# Patient Record
Sex: Male | Born: 1980
Health system: Southern US, Community
[De-identification: ages and names within clinical notes are randomized; demographics above are authoritative.]

## PROBLEM LIST (undated history)

## (undated) DIAGNOSIS — G8929 Other chronic pain: Secondary | ICD-10-CM

## (undated) DIAGNOSIS — K5792 Diverticulitis of intestine, part unspecified, without perforation or abscess without bleeding: Secondary | ICD-10-CM

## (undated) DIAGNOSIS — M797 Fibromyalgia: Secondary | ICD-10-CM

## (undated) DIAGNOSIS — F32A Depression, unspecified: Secondary | ICD-10-CM

## (undated) DIAGNOSIS — F419 Anxiety disorder, unspecified: Secondary | ICD-10-CM

## (undated) DIAGNOSIS — G4700 Insomnia, unspecified: Secondary | ICD-10-CM

## (undated) DIAGNOSIS — N200 Calculus of kidney: Secondary | ICD-10-CM

## (undated) DIAGNOSIS — M549 Dorsalgia, unspecified: Secondary | ICD-10-CM

## (undated) DIAGNOSIS — F329 Major depressive disorder, single episode, unspecified: Secondary | ICD-10-CM

## (undated) HISTORY — PX: COLON RESECTION: SHX5231

## (undated) HISTORY — PX: ABDOMINAL SURGERY: SHX537

## (undated) HISTORY — PX: TONSILLECTOMY: SUR1361

## (undated) HISTORY — DX: Fibromyalgia: M79.7

## (undated) HISTORY — PX: APPENDECTOMY: SHX54

---

## 2015-07-31 DIAGNOSIS — Z09 Encounter for follow-up examination after completed treatment for conditions other than malignant neoplasm: Secondary | ICD-10-CM | POA: Insufficient documentation

## 2015-12-13 ENCOUNTER — Emergency Department (HOSPITAL_BASED_OUTPATIENT_CLINIC_OR_DEPARTMENT_OTHER)
Admission: EM | Admit: 2015-12-13 | Discharge: 2015-12-13 | Disposition: A | Discharge status: Home / Self Care | Payer: BLUE CROSS/BLUE SHIELD | Attending: Emergency Medicine | Admitting: Emergency Medicine

## 2015-12-13 ENCOUNTER — Encounter (HOSPITAL_BASED_OUTPATIENT_CLINIC_OR_DEPARTMENT_OTHER): Payer: Self-pay | Admitting: Emergency Medicine

## 2015-12-13 DIAGNOSIS — F17210 Nicotine dependence, cigarettes, uncomplicated: Secondary | ICD-10-CM | POA: Insufficient documentation

## 2015-12-13 DIAGNOSIS — M545 Low back pain: Secondary | ICD-10-CM | POA: Diagnosis not present

## 2015-12-13 DIAGNOSIS — R079 Chest pain, unspecified: Secondary | ICD-10-CM | POA: Diagnosis not present

## 2015-12-13 DIAGNOSIS — R1031 Right lower quadrant pain: Secondary | ICD-10-CM | POA: Diagnosis not present

## 2015-12-13 DIAGNOSIS — R300 Dysuria: Secondary | ICD-10-CM | POA: Diagnosis not present

## 2015-12-13 DIAGNOSIS — R109 Unspecified abdominal pain: Secondary | ICD-10-CM | POA: Diagnosis present

## 2015-12-13 HISTORY — DX: Other chronic pain: G89.29

## 2015-12-13 HISTORY — DX: Dorsalgia, unspecified: M54.9

## 2015-12-13 HISTORY — DX: Diverticulitis of intestine, part unspecified, without perforation or abscess without bleeding: K57.92

## 2015-12-13 HISTORY — DX: Calculus of kidney: N20.0

## 2015-12-13 LAB — COMPREHENSIVE METABOLIC PANEL
ALT: 13 U/L — ABNORMAL LOW (ref 17–63)
AST: 24 U/L (ref 15–41)
Albumin: 5.1 g/dL — ABNORMAL HIGH (ref 3.5–5.0)
Alkaline Phosphatase: 63 U/L (ref 38–126)
Anion gap: 6 (ref 5–15)
BILIRUBIN TOTAL: 1.6 mg/dL — AB (ref 0.3–1.2)
BUN: 15 mg/dL (ref 6–20)
CHLORIDE: 104 mmol/L (ref 101–111)
CO2: 27 mmol/L (ref 22–32)
Calcium: 9.5 mg/dL (ref 8.9–10.3)
Creatinine, Ser: 0.93 mg/dL (ref 0.61–1.24)
Glucose, Bld: 81 mg/dL (ref 65–99)
POTASSIUM: 3.6 mmol/L (ref 3.5–5.1)
Sodium: 137 mmol/L (ref 135–145)
TOTAL PROTEIN: 8 g/dL (ref 6.5–8.1)

## 2015-12-13 LAB — CBC WITH DIFFERENTIAL/PLATELET
BAND NEUTROPHILS: 3 %
Basophils Absolute: 0.1 10*3/uL (ref 0.0–0.1)
Basophils Relative: 1 %
Eosinophils Absolute: 0 10*3/uL (ref 0.0–0.7)
Eosinophils Relative: 0 %
HEMATOCRIT: 43.8 % (ref 39.0–52.0)
Hemoglobin: 15.7 g/dL (ref 13.0–17.0)
LYMPHS ABS: 1.9 10*3/uL (ref 0.7–4.0)
LYMPHS PCT: 37 %
MCH: 33.3 pg (ref 26.0–34.0)
MCHC: 35.8 g/dL (ref 30.0–36.0)
MCV: 92.8 fL (ref 78.0–100.0)
MONO ABS: 0.4 10*3/uL (ref 0.1–1.0)
MYELOCYTES: 1 %
Monocytes Relative: 8 %
NEUTROS ABS: 2.6 10*3/uL (ref 1.7–7.7)
Neutrophils Relative %: 50 %
PLATELETS: 197 10*3/uL (ref 150–400)
RBC: 4.72 MIL/uL (ref 4.22–5.81)
RDW: 11.9 % (ref 11.5–15.5)
WBC: 5 10*3/uL (ref 4.0–10.5)

## 2015-12-13 LAB — URINALYSIS, ROUTINE W REFLEX MICROSCOPIC
Bilirubin Urine: NEGATIVE
GLUCOSE, UA: NEGATIVE mg/dL
HGB URINE DIPSTICK: NEGATIVE
LEUKOCYTES UA: NEGATIVE
Nitrite: NEGATIVE
PROTEIN: NEGATIVE mg/dL
Specific Gravity, Urine: 1.023 (ref 1.005–1.030)
pH: 7 (ref 5.0–8.0)

## 2015-12-13 LAB — LIPASE, BLOOD: LIPASE: 30 U/L (ref 11–51)

## 2015-12-13 NOTE — ED Provider Notes (Signed)
[facility] Provider Note   CSN: [id number] Arrival date & time: 12/13/15  1931  First Provider Contact:  First MD Initiated Contact with Patient 12/13/15 2001        History   Chief Complaint Chief Complaint  Patient presents with  . Abdominal Pain    HPI Drew Floyd is a 35 y.o. male.  Patient presents today with multiple complaints of chest pain, back pain, and intermittent RLQ / right sided flank pain for the past year that has worsened over the past few days. Patient reports a history of a ruptured diverticulum vs. Polyp (patient unsure) at the age of 11 requiring emergency surgery. He said his appendix was also removed at that time. Patient is follow by GI at [facility] in [facility] for colonoscopies every 4-6 years, most recently 2 months ago. He says they found and removed multiple polyps that were benign. He also had an upper endoscopy at that time with EGD dilation for a hiatal hernia. He reports a history of kidney stones 4 months ago that have since past. Ultrasound 2 months ago by his GI doctor showed no recurrent stones. He does endorse symptoms of dysuria. Denies hematuria. No fevers at home. As far as his chest pain, he describes it as left sided with radiation down his left arm. He has been having this chest pain intermittently for years. It is sharp and non exertional. Often occurs at rest. Patient's wife is a Engineer, civil (consulting) and has told him she believes it to be anxiety related. Lastly, patient complains of chronic lower back pain. 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 He has been worked up by his PCP with plain films and CT scan that were negative.       Past Medical History:  Diagnosis Date  . Chronic back pain   . Diverticulitis   . Kidney stone     There are no active problems to display for this patient.   Past Surgical History:  Procedure Laterality Date  . ABDOMINAL SURGERY         Home Medications    Prior to  Admission medications   Not on File    Family History History reviewed. No pertinent family history.  Social History Social History  Substance Use Topics  . Smoking status: Current Every Day Smoker    Types: Cigarettes  . Smokeless tobacco: Never Used  . Alcohol use No     Allergies   Penicillins   Review of Systems Review of Systems  Constitutional: Negative.  Negative for fever.  HENT: Negative.   Eyes: Negative.   Respiratory: Negative.   Cardiovascular: Positive for chest pain. Negative for palpitations.  Gastrointestinal: Positive for abdominal pain. Negative for vomiting.  Endocrine: Negative.   Genitourinary: Positive for dysuria.  Musculoskeletal: Positive for back pain.  Skin: Negative.   Allergic/Immunologic: Negative.   Neurological: Negative.   Hematological: Negative.   Psychiatric/Behavioral: Negative.      Physical Exam Updated Vital Signs BP 132/85 (BP Location: Right Arm)   Pulse (!) 59   Temp 97.6 F (36.4 C) (Oral)   Resp 18   Ht  (1.753 m)   Wt 56.7 kg   SpO2 99%   BMI 18.46 kg/m   Physical Exam  Constitutional: He appears well-developed and well-nourished.  HENT:  Head: Normocephalic and atraumatic.  Eyes: Conjunctivae are normal.  Neck: Neck supple.  Cardiovascular: Normal rate and regular rhythm.   No murmur heard. Pulmonary/Chest:  Effort normal and breath sounds normal. No respiratory distress.  Abdominal: Soft. Bowel sounds are normal. There is tenderness.  Well healed RLQ incision, mildly tender to deep palpation   Musculoskeletal: He exhibits no edema.  Neurological: He is alert.  Skin: Skin is warm and dry.  Psychiatric: He has a normal mood and affect.  Nursing note and vitals reviewed.    ED Treatments / Results  Labs (all labs ordered are listed, but only abnormal results are displayed) Labs Reviewed  COMPREHENSIVE METABOLIC PANEL - Abnormal; Notable for the following:       Result Value   Albumin 5.1  (*)    ALT 13 (*)    Total Bilirubin 1.6 (*)    All other components within normal limits  URINALYSIS, ROUTINE W REFLEX MICROSCOPIC (NOT AT [facility]) - Abnormal; Notable for the following:    Ketones, ur >80 (*)    All other components within normal limits  CBC WITH DIFFERENTIAL/PLATELET  LIPASE, BLOOD    EKG  EKG Interpretation None       Radiology No results found.  Procedures Procedures (including critical care time)  Medications Ordered in ED Medications - No data to display   Initial Impression / Assessment and Plan / ED Course  I have reviewed the triage vital signs and the nursing notes.  Pertinent labs & imaging results that were available during my care of the patient were reviewed by me and considered in my medical decision making (see chart for details).  Clinical Course  Value Comment By Time  Total Bilirubin: (!) 1.6 (Reviewed) Nelva Nayobert Beaton, MD 08/11 2110   Abdominal pain: Recent CT in March negative. UA, CBC, CMP, and lipase unremarkable . Exam benign. Offered patient reassurance. No indication for further imaging at this time. Encouraged patient to follow up with PCP and GI doctor for further evaluation.   Final Clinical Impressions(s) / ED Diagnoses   Final diagnoses:  Right lower quadrant abdominal pain    New Prescriptions There are no discharge medications for this patient.    Reymundo Pollarolyn Floree Zuniga, MD 12/14/15 1305     Reymundo Pollarolyn Zahli Vetsch, MD 12/14/15 [id number]    Nelva Nayobert Beaton, MD 12/22/15 [phone]

## 2015-12-13 NOTE — ED Triage Notes (Signed)
Patient states that he is having right sided abdominal pain and right flank pain. Patient has a history of a ruptured diverticulum in 2001 and since he had his last Colonoscopy 2 months ago he feels like "I am systematically breaking down" - Reports left sided chest pain and arm pain x 1 month.

## 2015-12-13 NOTE — Discharge Instructions (Signed)
Please follow up with your primary care doctor for further work up and evaluation.

## 2016-02-24 DIAGNOSIS — R1030 Lower abdominal pain, unspecified: Secondary | ICD-10-CM | POA: Diagnosis not present

## 2016-02-24 DIAGNOSIS — G8929 Other chronic pain: Secondary | ICD-10-CM | POA: Diagnosis not present

## 2016-02-24 DIAGNOSIS — M542 Cervicalgia: Secondary | ICD-10-CM | POA: Diagnosis not present

## 2016-02-24 DIAGNOSIS — R29898 Other symptoms and signs involving the musculoskeletal system: Secondary | ICD-10-CM | POA: Diagnosis not present

## 2016-02-24 DIAGNOSIS — M545 Low back pain: Secondary | ICD-10-CM | POA: Diagnosis not present

## 2016-02-27 DIAGNOSIS — N401 Enlarged prostate with lower urinary tract symptoms: Secondary | ICD-10-CM | POA: Diagnosis not present

## 2016-02-27 DIAGNOSIS — N3289 Other specified disorders of bladder: Secondary | ICD-10-CM | POA: Diagnosis not present

## 2016-02-29 DIAGNOSIS — M5126 Other intervertebral disc displacement, lumbar region: Secondary | ICD-10-CM | POA: Diagnosis not present

## 2016-03-16 DIAGNOSIS — M545 Low back pain: Secondary | ICD-10-CM | POA: Diagnosis not present

## 2016-03-16 DIAGNOSIS — M4696 Unspecified inflammatory spondylopathy, lumbar region: Secondary | ICD-10-CM | POA: Diagnosis not present

## 2016-03-16 DIAGNOSIS — M5136 Other intervertebral disc degeneration, lumbar region: Secondary | ICD-10-CM | POA: Diagnosis not present

## 2016-03-16 DIAGNOSIS — R29898 Other symptoms and signs involving the musculoskeletal system: Secondary | ICD-10-CM | POA: Diagnosis not present

## 2016-05-01 DIAGNOSIS — M4696 Unspecified inflammatory spondylopathy, lumbar region: Secondary | ICD-10-CM | POA: Diagnosis not present

## 2016-05-01 DIAGNOSIS — M5136 Other intervertebral disc degeneration, lumbar region: Secondary | ICD-10-CM | POA: Diagnosis not present

## 2016-05-23 ENCOUNTER — Encounter (HOSPITAL_BASED_OUTPATIENT_CLINIC_OR_DEPARTMENT_OTHER): Payer: Self-pay | Admitting: Emergency Medicine

## 2016-05-23 ENCOUNTER — Emergency Department (HOSPITAL_BASED_OUTPATIENT_CLINIC_OR_DEPARTMENT_OTHER): Payer: BLUE CROSS/BLUE SHIELD

## 2016-05-23 DIAGNOSIS — R079 Chest pain, unspecified: Secondary | ICD-10-CM | POA: Diagnosis not present

## 2016-05-23 DIAGNOSIS — F419 Anxiety disorder, unspecified: Secondary | ICD-10-CM | POA: Diagnosis not present

## 2016-05-23 DIAGNOSIS — R072 Precordial pain: Secondary | ICD-10-CM | POA: Diagnosis present

## 2016-05-23 DIAGNOSIS — F17210 Nicotine dependence, cigarettes, uncomplicated: Secondary | ICD-10-CM | POA: Diagnosis not present

## 2016-05-23 DIAGNOSIS — F411 Generalized anxiety disorder: Secondary | ICD-10-CM | POA: Diagnosis not present

## 2016-05-23 LAB — CBC
HCT: 44 % (ref 39.0–52.0)
Hemoglobin: 15.7 g/dL (ref 13.0–17.0)
MCH: 32.8 pg (ref 26.0–34.0)
MCHC: 35.7 g/dL (ref 30.0–36.0)
MCV: 91.9 fL (ref 78.0–100.0)
PLATELETS: 226 10*3/uL (ref 150–400)
RBC: 4.79 MIL/uL (ref 4.22–5.81)
RDW: 12.1 % (ref 11.5–15.5)
WBC: 5 10*3/uL (ref 4.0–10.5)

## 2016-05-23 LAB — BASIC METABOLIC PANEL
Anion gap: 8 (ref 5–15)
BUN: 16 mg/dL (ref 6–20)
CALCIUM: 10 mg/dL (ref 8.9–10.3)
CHLORIDE: 101 mmol/L (ref 101–111)
CO2: 29 mmol/L (ref 22–32)
CREATININE: 0.82 mg/dL (ref 0.61–1.24)
Glucose, Bld: 95 mg/dL (ref 65–99)
Potassium: 4.1 mmol/L (ref 3.5–5.1)
SODIUM: 138 mmol/L (ref 135–145)

## 2016-05-23 LAB — TROPONIN I

## 2016-05-23 NOTE — ED Triage Notes (Signed)
Patient states that he is having pain to his left chest up into her left chest and down her left arm. He states that the his wife is a Engineer, civil (consulting)nurse and he "was having gaps in his hear beat when he is having the pain"

## 2016-05-24 ENCOUNTER — Emergency Department (HOSPITAL_BASED_OUTPATIENT_CLINIC_OR_DEPARTMENT_OTHER)
Admission: EM | Admit: 2016-05-24 | Discharge: 2016-05-24 | Disposition: A | Discharge status: Home / Self Care | Payer: BLUE CROSS/BLUE SHIELD | Attending: Emergency Medicine | Admitting: Emergency Medicine

## 2016-05-24 ENCOUNTER — Encounter (HOSPITAL_BASED_OUTPATIENT_CLINIC_OR_DEPARTMENT_OTHER): Payer: Self-pay | Admitting: *Deleted

## 2016-05-24 DIAGNOSIS — F419 Anxiety disorder, unspecified: Secondary | ICD-10-CM

## 2016-05-24 HISTORY — DX: Depression, unspecified: F32.A

## 2016-05-24 HISTORY — DX: Insomnia, unspecified: G47.00

## 2016-05-24 HISTORY — DX: Anxiety disorder, unspecified: F41.9

## 2016-05-24 HISTORY — DX: Major depressive disorder, single episode, unspecified: F32.9

## 2016-05-24 NOTE — ED Provider Notes (Signed)
[facility]   CSN: [id number] Arrival date & time: 05/23/16  2034     History   Chief Complaint Chief Complaint  Patient presents with  . Chest Pain    HPI Drew Floyd is a 36 y.o. male.  The history is provided by the patient.  Chest Pain   This is a chronic problem. The current episode started more than 1 week ago (6 months ago). The problem occurs constantly. The problem has been gradually worsening. The pain is associated with rest (with smoking pot and is under stress due to back issues and loss of job and feels shaky all over with the episode). The pain is present in the substernal region. The pain is moderate. The quality of the pain is described as dull. The pain does not radiate. Pertinent negatives include no abdominal pain, no claudication, no cough, no diaphoresis, no exertional chest pressure, no headaches, no hemoptysis, no lower extremity edema, no numbness, no orthopnea, no palpitations, no PND, no shortness of breath and no syncope. He has tried nothing for the symptoms. The treatment provided no relief. Risk factors include male gender.  Pertinent negatives for past medical history include no aneurysm and no MI.  Pertinent negatives for family medical history include: no aortic dissection.  Procedure history is negative for cardiac catheterization.  Last night mom gave him her xanax for same and it resolved the problem  Past Medical History:  Diagnosis Date  . Chronic back pain   . Diverticulitis   . Kidney stone     There are no active problems to display for this patient.   Past Surgical History:  Procedure Laterality Date  . ABDOMINAL SURGERY         Home Medications    Prior to Admission medications   Not on File    Family History History reviewed. No pertinent family history.  Social History Social History  Substance Use Topics  . Smoking status: Current Every Day Smoker    Types: Cigarettes  . Smokeless  tobacco: Never Used  . Alcohol use No     Allergies   Penicillins   Review of Systems Review of Systems  Constitutional: Negative for diaphoresis.  Respiratory: Negative for cough, hemoptysis and shortness of breath.   Cardiovascular: Positive for chest pain. Negative for palpitations, orthopnea, claudication, leg swelling, syncope and PND.  Gastrointestinal: Negative for abdominal pain.  Neurological: Positive for tremors. Negative for numbness and headaches.  Psychiatric/Behavioral: The patient is nervous/anxious.   All other systems reviewed and are negative.    Physical Exam Updated Vital Signs BP 116/88   Pulse 61   Temp 98.4 F (36.9 C) (Oral)   Resp 17   Ht 5\' 10"  (1.778 m)   Wt 118 lb (53.5 kg)   SpO2 99%   BMI 16.93 kg/m   Physical Exam  Constitutional: He is oriented to person, place, and time. He appears well-developed and well-nourished. No distress.  HENT:  Head: Normocephalic and atraumatic.  Mouth/Throat: No oropharyngeal exudate.  Eyes: EOM are normal. Pupils are equal, round, and reactive to light.  Neck: Normal range of motion. Neck supple. No JVD present. No tracheal deviation present.  Cardiovascular: Normal rate, regular rhythm, normal heart sounds and intact distal pulses.   Pulmonary/Chest: Effort normal and breath sounds normal. No stridor. He has no wheezes. He has no rales.  Abdominal: Soft. Bowel sounds are normal. He exhibits no mass. There is no tenderness. There is no rebound and  no guarding.  Musculoskeletal: Normal range of motion. He exhibits no tenderness or deformity.  Lymphadenopathy:    He has no cervical adenopathy.  Neurological: He is alert and oriented to person, place, and time. He displays normal reflexes. No sensory deficit. He exhibits normal muscle tone. Coordination normal.  Skin: Skin is warm and dry. Capillary refill takes less than 2 seconds.  Psychiatric: His mood appears anxious. He expresses no homicidal and no  suicidal ideation. He expresses no suicidal plans and no homicidal plans.     ED Treatments / Results   Vitals:   05/24/16 0230 05/24/16 0300  BP: 117/87 116/88  Pulse: 60 61  Resp: 16 17  Temp:     Results for orders placed or performed during the hospital encounter of 05/24/16  Basic metabolic panel  Result Value Ref Range   Sodium 138 135 - 145 mmol/L   Potassium 4.1 3.5 - 5.1 mmol/L   Chloride 101 101 - 111 mmol/L   CO2 29 22 - 32 mmol/L   Glucose, Bld 95 65 - 99 mg/dL   BUN 16 6 - 20 mg/dL   Creatinine, Ser 4.090.82 0.61 - 1.24 mg/dL   Calcium 81.110.0 8.9 - 91.410.3 mg/dL   GFR calc non Af Amer >60 >60 mL/min   GFR calc Af Amer >60 >60 mL/min   Anion gap 8 5 - 15  CBC  Result Value Ref Range   WBC 5.0 4.0 - 10.5 K/uL   RBC 4.79 4.22 - 5.81 MIL/uL   Hemoglobin 15.7 13.0 - 17.0 g/dL   HCT 78.244.0 95.639.0 - 21.352.0 %   MCV 91.9 78.0 - 100.0 fL   MCH 32.8 26.0 - 34.0 pg   MCHC 35.7 30.0 - 36.0 g/dL   RDW 08.612.1 57.811.5 - 46.915.5 %   Platelets 226 150 - 400 K/uL  Troponin I  Result Value Ref Range   Troponin I <0.03 <0.03 ng/mL   Dg Chest 2 View  Result Date: 05/23/2016 CLINICAL DATA:  Worsening chest pain for 2 months EXAM: CHEST  2 VIEW COMPARISON:  None. FINDINGS: Hyperinflation. No focal pulmonary infiltrate, consolidation or effusion. Normal heart size. No pneumothorax. IMPRESSION: Hyperinflation without focal infiltrate Electronically Signed   By: Jasmine PangKim  Fujinaga M.D.   On: 05/23/2016 21:36    EKG  EKG Interpretation  Date/Time:  Saturday May 23 2016 20:40:41 EST Ventricular Rate:  69 PR Interval:  148 QRS Duration: 90 QT Interval:  378 QTC Calculation: 405 R Axis:   107 Text Interpretation:  Normal sinus rhythm Rightward axis Pulmonary disease pattern Abnormal ECG No old tracing to compare Confirmed by BELFI  MD, MELANIE ([id number]) on 05/23/2016 9:27:26 PM       Radiology Dg Chest 2 View  Result Date: 05/23/2016 CLINICAL DATA:  Worsening chest pain for 2 months EXAM: CHEST   2 VIEW COMPARISON:  None. FINDINGS: Hyperinflation. No focal pulmonary infiltrate, consolidation or effusion. Normal heart size. No pneumothorax. IMPRESSION: Hyperinflation without focal infiltrate Electronically Signed   By: Jasmine PangKim  Fujinaga M.D.   On: 05/23/2016 21:36    Procedures Procedures (including critical care time)  Medications Ordered in ED Medications - No data to display  PERC negative wells 0 highyl doubt PE in this very low risk patient.  HEART score is 1 safe for discharge ruled out for MI in the ed  Final Clinical Impressions(s) / ED Diagnoses  Anxiety worse with pot usage.  Stop smoking and using marijuana.  Follow up with your PMD.  All questions answered to patient's satisfaction. Based on history and exam patient has been appropriately medically screened and emergency conditions excluded. Patient is stable for discharge at this time. Strict return precautions given for any further episodes, persistent fever, weakness or any concerns. New Prescriptions New Prescriptions   No medications on file     Kanyia Heaslip, MD 05/24/16 0403

## 2016-05-24 NOTE — ED Notes (Signed)
EDP into room 

## 2016-05-24 NOTE — ED Notes (Addendum)
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 Admits to h/o klonopin, and zyprexxa

## 2016-05-28 DIAGNOSIS — R5383 Other fatigue: Secondary | ICD-10-CM | POA: Diagnosis not present

## 2016-05-28 DIAGNOSIS — R109 Unspecified abdominal pain: Secondary | ICD-10-CM | POA: Diagnosis not present

## 2016-05-28 DIAGNOSIS — R079 Chest pain, unspecified: Secondary | ICD-10-CM | POA: Diagnosis not present

## 2016-05-28 DIAGNOSIS — Z681 Body mass index (BMI) 19 or less, adult: Secondary | ICD-10-CM | POA: Diagnosis not present

## 2016-06-01 DIAGNOSIS — M4696 Unspecified inflammatory spondylopathy, lumbar region: Secondary | ICD-10-CM | POA: Diagnosis not present

## 2016-06-01 DIAGNOSIS — M545 Low back pain: Secondary | ICD-10-CM | POA: Diagnosis not present

## 2016-06-01 DIAGNOSIS — M542 Cervicalgia: Secondary | ICD-10-CM | POA: Diagnosis not present

## 2016-06-01 DIAGNOSIS — M5136 Other intervertebral disc degeneration, lumbar region: Secondary | ICD-10-CM | POA: Diagnosis not present

## 2016-06-05 DIAGNOSIS — R0789 Other chest pain: Secondary | ICD-10-CM | POA: Diagnosis not present

## 2016-06-05 DIAGNOSIS — R748 Abnormal levels of other serum enzymes: Secondary | ICD-10-CM | POA: Diagnosis not present

## 2016-06-05 DIAGNOSIS — R1084 Generalized abdominal pain: Secondary | ICD-10-CM | POA: Diagnosis not present

## 2016-06-05 DIAGNOSIS — R55 Syncope and collapse: Secondary | ICD-10-CM | POA: Diagnosis not present

## 2016-06-05 DIAGNOSIS — R079 Chest pain, unspecified: Secondary | ICD-10-CM | POA: Diagnosis not present

## 2016-06-05 DIAGNOSIS — R109 Unspecified abdominal pain: Secondary | ICD-10-CM | POA: Diagnosis not present

## 2016-06-08 DIAGNOSIS — N179 Acute kidney failure, unspecified: Secondary | ICD-10-CM | POA: Diagnosis not present

## 2016-06-08 DIAGNOSIS — R1013 Epigastric pain: Secondary | ICD-10-CM | POA: Diagnosis not present

## 2016-06-08 DIAGNOSIS — N200 Calculus of kidney: Secondary | ICD-10-CM | POA: Diagnosis not present

## 2016-06-09 DIAGNOSIS — N200 Calculus of kidney: Secondary | ICD-10-CM | POA: Diagnosis not present

## 2016-06-09 DIAGNOSIS — R1013 Epigastric pain: Secondary | ICD-10-CM | POA: Diagnosis not present

## 2016-06-10 DIAGNOSIS — R079 Chest pain, unspecified: Secondary | ICD-10-CM | POA: Diagnosis not present

## 2016-06-18 DIAGNOSIS — F410 Panic disorder [episodic paroxysmal anxiety] without agoraphobia: Secondary | ICD-10-CM | POA: Diagnosis not present

## 2016-06-18 DIAGNOSIS — F418 Other specified anxiety disorders: Secondary | ICD-10-CM | POA: Diagnosis not present

## 2016-06-18 DIAGNOSIS — Z681 Body mass index (BMI) 19 or less, adult: Secondary | ICD-10-CM | POA: Diagnosis not present

## 2016-06-18 DIAGNOSIS — K5730 Diverticulosis of large intestine without perforation or abscess without bleeding: Secondary | ICD-10-CM | POA: Diagnosis not present

## 2016-07-16 DIAGNOSIS — F418 Other specified anxiety disorders: Secondary | ICD-10-CM | POA: Diagnosis not present

## 2016-07-16 DIAGNOSIS — R109 Unspecified abdominal pain: Secondary | ICD-10-CM | POA: Diagnosis not present

## 2016-07-16 DIAGNOSIS — F410 Panic disorder [episodic paroxysmal anxiety] without agoraphobia: Secondary | ICD-10-CM | POA: Diagnosis not present

## 2016-07-16 DIAGNOSIS — Z681 Body mass index (BMI) 19 or less, adult: Secondary | ICD-10-CM | POA: Diagnosis not present

## 2016-07-21 DIAGNOSIS — K011 Impacted teeth: Secondary | ICD-10-CM | POA: Diagnosis not present

## 2016-08-31 DIAGNOSIS — M5136 Other intervertebral disc degeneration, lumbar region: Secondary | ICD-10-CM | POA: Diagnosis not present

## 2016-08-31 DIAGNOSIS — M4696 Unspecified inflammatory spondylopathy, lumbar region: Secondary | ICD-10-CM | POA: Diagnosis not present

## 2016-08-31 DIAGNOSIS — M545 Low back pain: Secondary | ICD-10-CM | POA: Diagnosis not present

## 2016-08-31 DIAGNOSIS — M542 Cervicalgia: Secondary | ICD-10-CM | POA: Diagnosis not present

## 2017-03-26 DIAGNOSIS — Z5321 Procedure and treatment not carried out due to patient leaving prior to being seen by health care provider: Secondary | ICD-10-CM | POA: Diagnosis not present

## 2017-03-28 DIAGNOSIS — R079 Chest pain, unspecified: Secondary | ICD-10-CM | POA: Diagnosis not present

## 2017-05-31 IMAGING — CR DG CHEST 2V
2 series · 2 of 2 positions shown · non-contrast
Comparison: None.

CLINICAL DATA: Worsening chest pain for 2 months

EXAM:
CHEST  2 VIEW

[w chest pa]
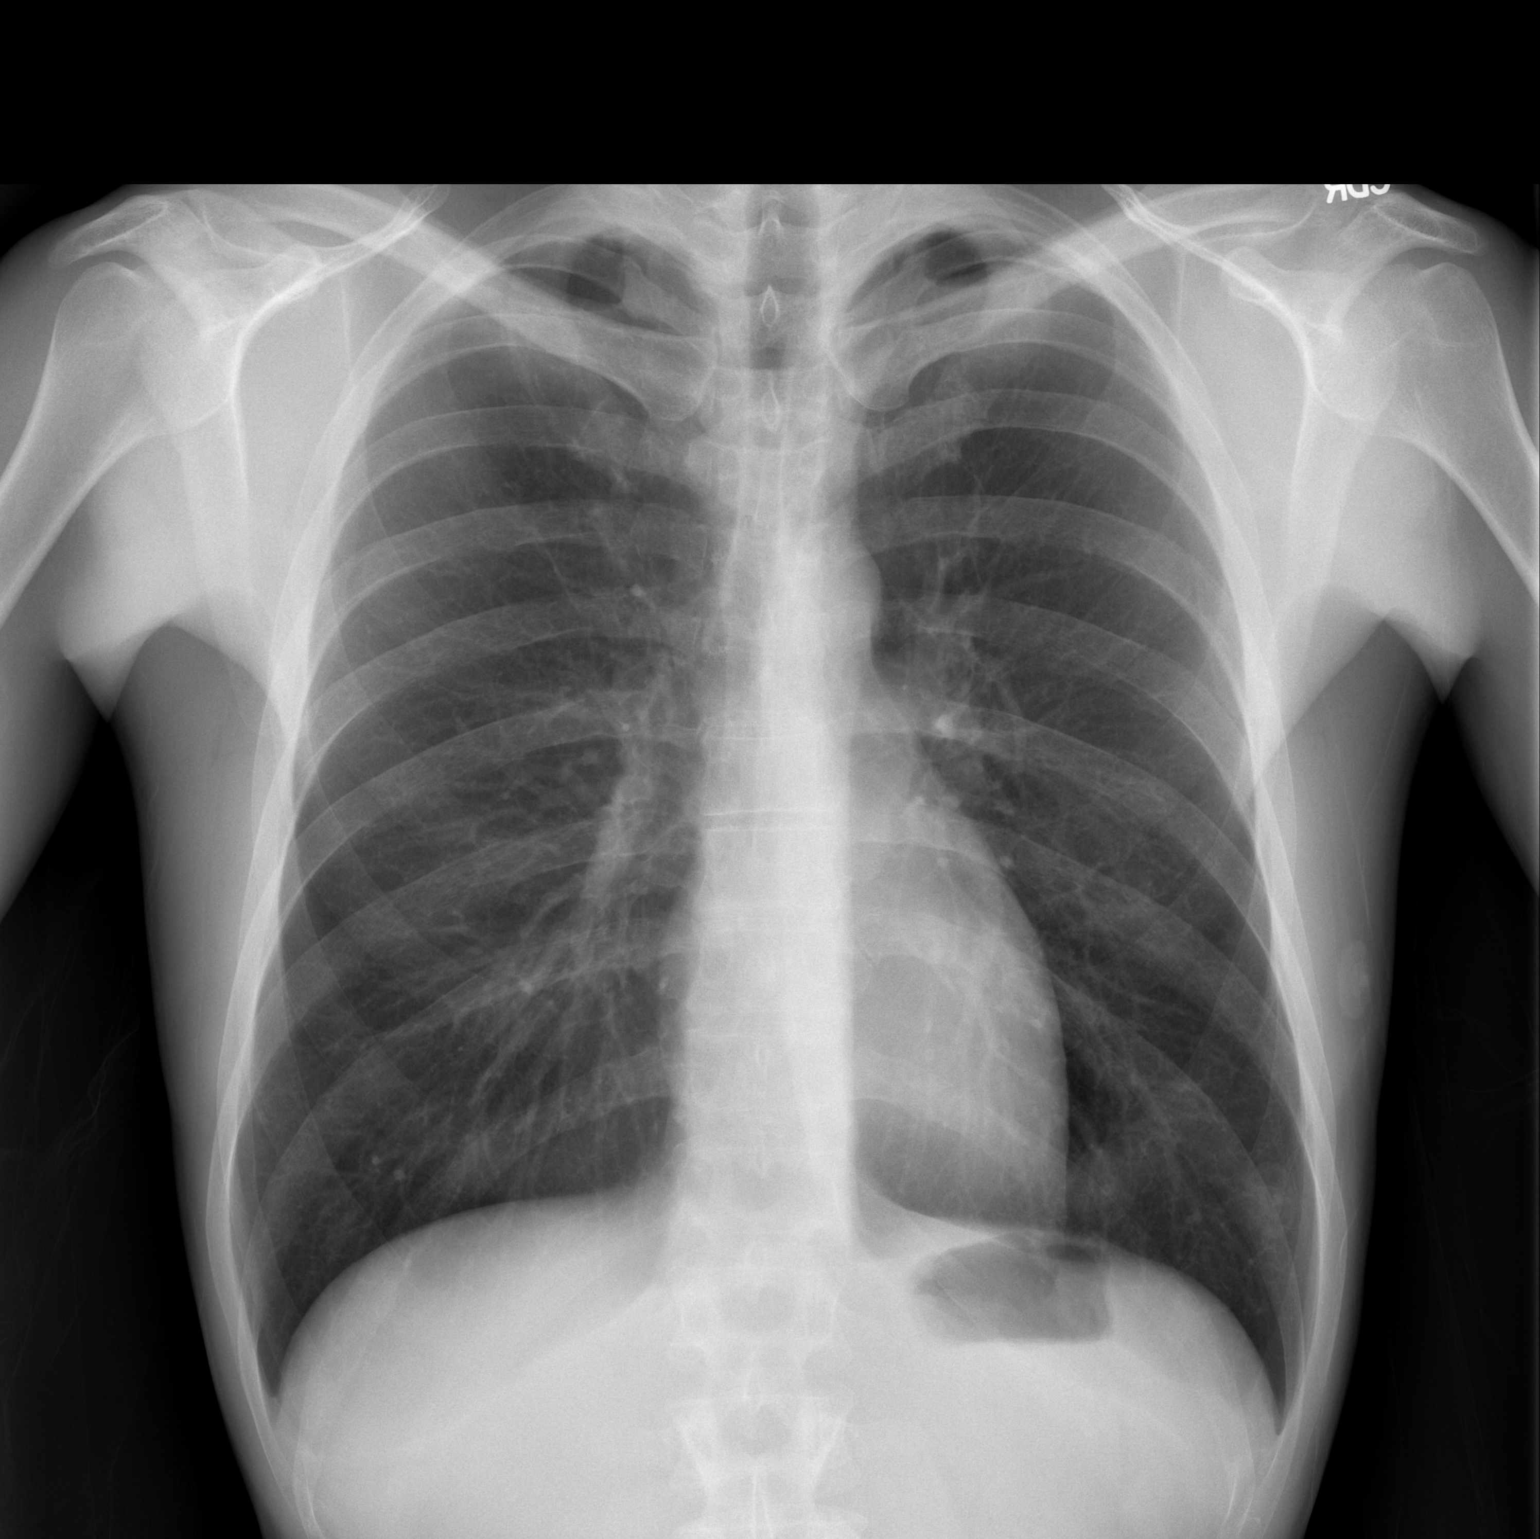

[w chest lat]
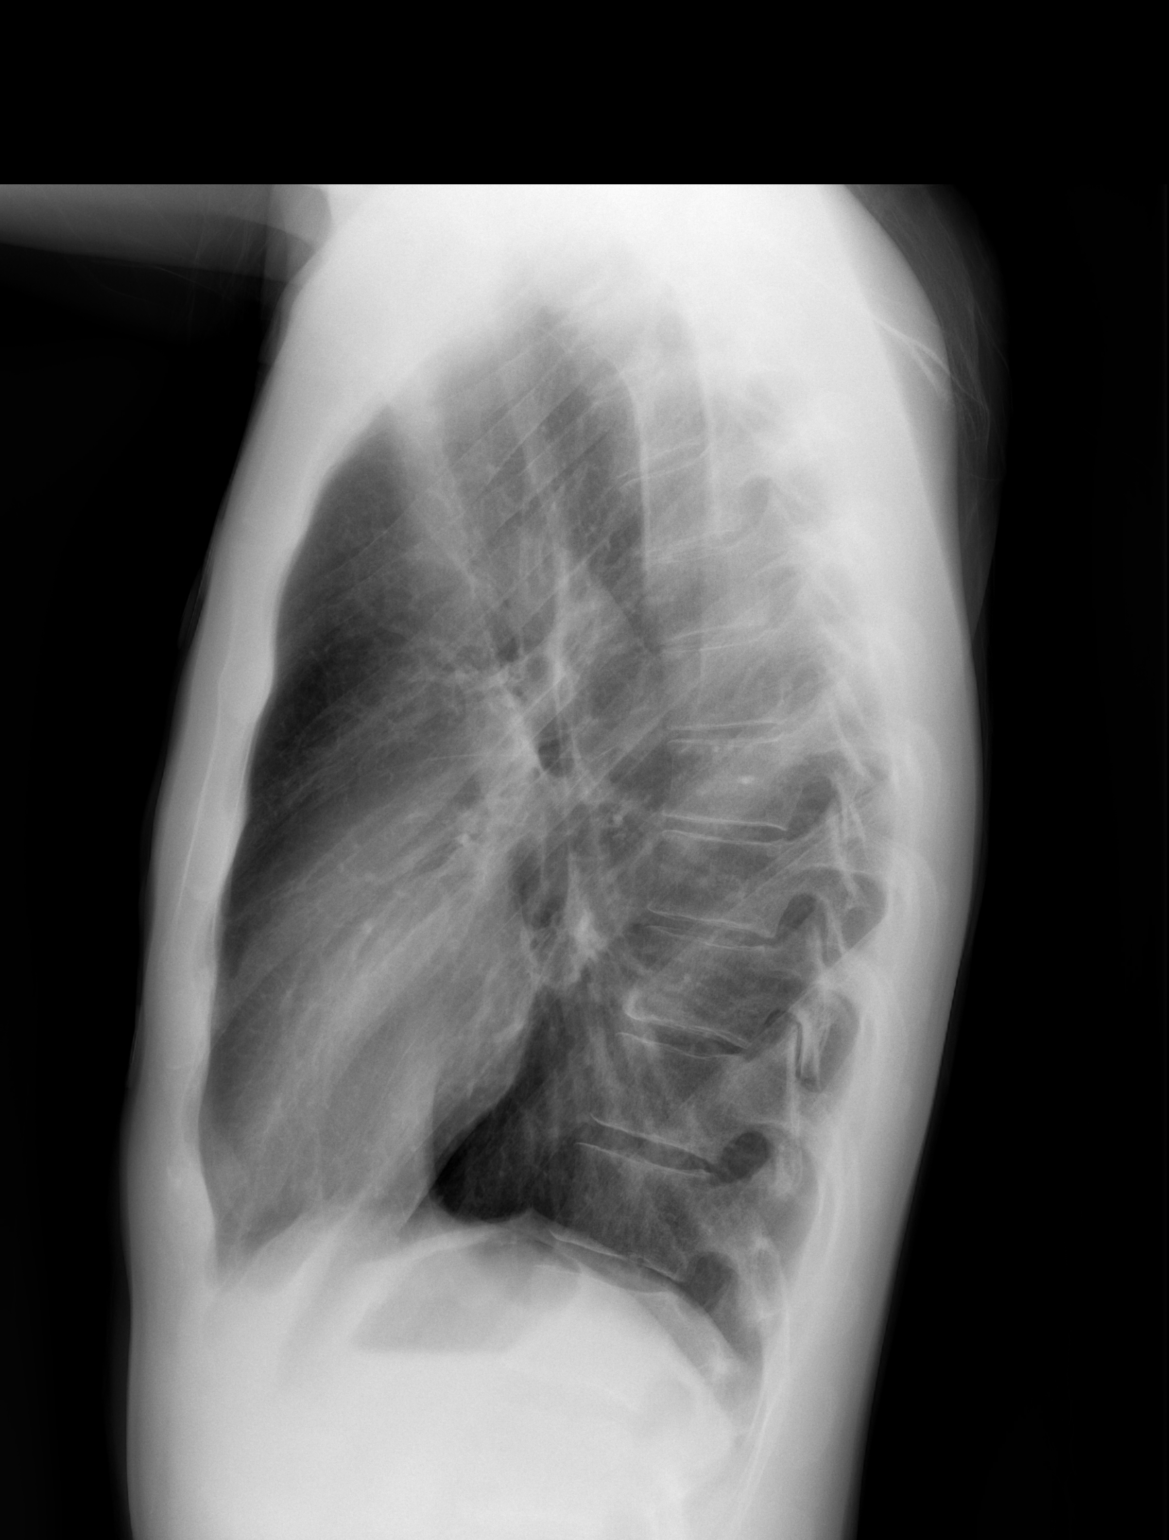

[2 of 2 positions shown; findings below may reference images not displayed]

FINDINGS: Hyperinflation. No focal pulmonary infiltrate, consolidation or
effusion. Normal heart size. No pneumothorax.
IMPRESSION: Hyperinflation without focal infiltrate

## 2017-06-23 DIAGNOSIS — Z681 Body mass index (BMI) 19 or less, adult: Secondary | ICD-10-CM | POA: Diagnosis not present

## 2017-06-23 DIAGNOSIS — R39198 Other difficulties with micturition: Secondary | ICD-10-CM | POA: Diagnosis not present

## 2017-06-23 DIAGNOSIS — R0602 Shortness of breath: Secondary | ICD-10-CM | POA: Diagnosis not present

## 2017-06-24 ENCOUNTER — Ambulatory Visit: Payer: BLUE CROSS/BLUE SHIELD | Admitting: Cardiology

## 2017-06-24 ENCOUNTER — Encounter: Payer: Self-pay | Admitting: Cardiology

## 2017-06-24 DIAGNOSIS — R079 Chest pain, unspecified: Secondary | ICD-10-CM | POA: Diagnosis not present

## 2017-06-24 DIAGNOSIS — R5383 Other fatigue: Secondary | ICD-10-CM | POA: Diagnosis not present

## 2017-06-24 DIAGNOSIS — R61 Generalized hyperhidrosis: Secondary | ICD-10-CM | POA: Diagnosis not present

## 2017-06-24 DIAGNOSIS — I4510 Unspecified right bundle-branch block: Secondary | ICD-10-CM | POA: Diagnosis not present

## 2017-06-24 DIAGNOSIS — R110 Nausea: Secondary | ICD-10-CM | POA: Diagnosis not present

## 2017-06-24 DIAGNOSIS — I2129 ST elevation (STEMI) myocardial infarction involving other sites: Secondary | ICD-10-CM | POA: Diagnosis not present

## 2017-06-24 DIAGNOSIS — R1013 Epigastric pain: Secondary | ICD-10-CM | POA: Diagnosis not present

## 2017-07-21 DIAGNOSIS — Z1331 Encounter for screening for depression: Secondary | ICD-10-CM | POA: Diagnosis not present

## 2017-07-21 DIAGNOSIS — M549 Dorsalgia, unspecified: Secondary | ICD-10-CM | POA: Diagnosis not present

## 2017-07-21 DIAGNOSIS — E46 Unspecified protein-calorie malnutrition: Secondary | ICD-10-CM | POA: Diagnosis not present

## 2017-07-21 DIAGNOSIS — F339 Major depressive disorder, recurrent, unspecified: Secondary | ICD-10-CM | POA: Diagnosis not present

## 2017-07-21 DIAGNOSIS — R5382 Chronic fatigue, unspecified: Secondary | ICD-10-CM | POA: Diagnosis not present

## 2017-08-03 ENCOUNTER — Ambulatory Visit: Payer: BLUE CROSS/BLUE SHIELD | Admitting: Neurology

## 2017-08-03 ENCOUNTER — Telehealth: Payer: Self-pay | Admitting: *Deleted

## 2017-08-03 NOTE — Telephone Encounter (Signed)
No showed new patient appointment. 

## 2017-08-04 ENCOUNTER — Encounter: Payer: Self-pay | Admitting: Neurology

## 2017-11-26 DIAGNOSIS — M797 Fibromyalgia: Secondary | ICD-10-CM | POA: Diagnosis not present

## 2017-11-26 DIAGNOSIS — Z681 Body mass index (BMI) 19 or less, adult: Secondary | ICD-10-CM | POA: Diagnosis not present

## 2017-11-26 DIAGNOSIS — R079 Chest pain, unspecified: Secondary | ICD-10-CM | POA: Diagnosis not present

## 2017-12-01 DIAGNOSIS — R51 Headache: Secondary | ICD-10-CM | POA: Diagnosis not present

## 2017-12-01 DIAGNOSIS — R52 Pain, unspecified: Secondary | ICD-10-CM | POA: Diagnosis not present

## 2017-12-01 DIAGNOSIS — R748 Abnormal levels of other serum enzymes: Secondary | ICD-10-CM | POA: Diagnosis not present

## 2017-12-01 DIAGNOSIS — R918 Other nonspecific abnormal finding of lung field: Secondary | ICD-10-CM | POA: Diagnosis not present

## 2017-12-01 DIAGNOSIS — R001 Bradycardia, unspecified: Secondary | ICD-10-CM | POA: Diagnosis not present

## 2017-12-01 DIAGNOSIS — R55 Syncope and collapse: Secondary | ICD-10-CM | POA: Diagnosis not present

## 2017-12-01 DIAGNOSIS — G4489 Other headache syndrome: Secondary | ICD-10-CM | POA: Diagnosis not present

## 2017-12-01 DIAGNOSIS — R079 Chest pain, unspecified: Secondary | ICD-10-CM | POA: Diagnosis not present

## 2017-12-01 DIAGNOSIS — M545 Low back pain: Secondary | ICD-10-CM | POA: Diagnosis not present

## 2017-12-01 DIAGNOSIS — R1084 Generalized abdominal pain: Secondary | ICD-10-CM | POA: Diagnosis not present

## 2017-12-01 DIAGNOSIS — M5442 Lumbago with sciatica, left side: Secondary | ICD-10-CM | POA: Diagnosis not present

## 2017-12-01 DIAGNOSIS — M5489 Other dorsalgia: Secondary | ICD-10-CM | POA: Diagnosis not present

## 2017-12-01 DIAGNOSIS — M5441 Lumbago with sciatica, right side: Secondary | ICD-10-CM | POA: Diagnosis not present

## 2017-12-01 DIAGNOSIS — R6883 Chills (without fever): Secondary | ICD-10-CM | POA: Diagnosis not present

## 2017-12-08 DIAGNOSIS — E46 Unspecified protein-calorie malnutrition: Secondary | ICD-10-CM | POA: Diagnosis not present

## 2017-12-08 DIAGNOSIS — M25512 Pain in left shoulder: Secondary | ICD-10-CM | POA: Diagnosis not present

## 2017-12-08 DIAGNOSIS — K861 Other chronic pancreatitis: Secondary | ICD-10-CM | POA: Diagnosis not present

## 2017-12-08 DIAGNOSIS — K8689 Other specified diseases of pancreas: Secondary | ICD-10-CM | POA: Diagnosis not present

## 2017-12-09 DIAGNOSIS — R1013 Epigastric pain: Secondary | ICD-10-CM | POA: Diagnosis not present

## 2017-12-13 NOTE — Progress Notes (Signed)
Cardiology Office Note:    Date:  12/14/2017   ID:  Drew Floyd, DOB 1980/06/04, MRN [id number]  PCP:  [facility]  Cardiologist:  Norman Herrlich, MD   Referring MD: Dulce Sellar, NP  ASSESSMENT:    1. Costochondritis   2. Epigastric pain   3. EKG abnormality   4. Chest pain, unspecified type    PLAN:    In order of problems listed above:  1. His symptoms are nonanginal very characteristic of costochondral pain syndrome and do not seem to be severe.  At this time I will hold on active treatment with anti-inflammatory agents in view of his intolerance of NSAIDs he tells me he had renal insufficiency with them and worsened abdominal pain.  His symptoms worsen or persist or cause concern we can use physical therapy modalities including iontophoresis of high potency steroids.  He will undergo echocardiogram for reassurance and I will check an EKG before he leaves my office today as he is varying reports and the ones from the emergency room and I cannot physically see it.  I reviewed his labs he had a recent normal CBC and CMP including renal function.  In particular I do not think he requires an ischemia evaluation. 2. Chronic abdominal pain weight loss elevated lipase is being managed by his PCP he would likely benefit from GI referral and will need some sort of imaging of his biliary tract and pancreatic system. 3. His EKG is a variant of normal and does not exhibit bundle branch block  Next appointment 4 to 6 weeks   Medication Adjustments/Labs and Tests Ordered: Current medicines are reviewed at length with the patient today.  Concerns regarding medicines are outlined above.  Orders Placed This Encounter  Procedures  . EKG 12-Lead  . ECHOCARDIOGRAM COMPLETE   No orders of the defined types were placed in this encounter.    Chief Complaint  Patient presents with  . Chest Pain    When lying on right side, through out the day and while sleeping, sometimes has  pain in left arm    History of Present Illness:    Drew Floyd is a 37 y.o. male who is being seen today for the evaluation of chest pai at the request of Dulce Sellar, NP.  His predominant pain has been persistent epigastric and mid abdominal pain loss of appetite weight loss of about 40 pounds and he tells me his lipase was elevated and is concerning his chronic pancreatitis.  He is pending evaluation.  In the last few months he said a separate problem with chest pain tends to occur at night it is positional alleviator problem to his left localized along the left costochondral junction sharp in nature tends to be brief and momentary and as mentioned sharp in consistency.  No shortness of breath not blurred pleuritic he has no history of venous thromboembolism or risk factors no history of underlying congenital or other heart disease.  Overall he feels poorly with weakness but is not having exertional chest pain shortness of breath palpitation or syncope.  He has had EKGs performed at [facility] regional emergency room best described as both normal sinus bradycardia and incomplete right bundle branch block I will repeat one my office today on physical exam his symptoms are reproducible palpating the costochondral junction he is intolerant of nonsteroidal anti-inflammatory drugs and at this time I would not place him on on the medication.  He responded well to reassurance we will do an echocardiogram  to assure ourselves there is no unusual underlying cardiac disease such as chronic pericarditis cardiomyopathy or even pericardial cyst.  I suspect to be normal.  In the interim I strongly encouraged him to have GI evaluation.  I do not think he needs an ischemia evaluation his pretest probability is low and his symptoms are nonanginal in nature he did have a troponin assessed in the emergency room July 2019 which was normal.  Uncommonly chest pain does radiate to the left arm but this is not his usual  pattern   Past Medical History:  Diagnosis Date  . Anxiety   . Chronic back pain   . Depression   . Diverticulitis   . Fibromyalgia   . Insomnia   . Kidney stone     Past Surgical History:  Procedure Laterality Date  . ABDOMINAL SURGERY    . APPENDECTOMY    . COLON RESECTION    . TONSILLECTOMY      Current Medications: No outpatient medications have been marked as taking for the 12/14/17 encounter (Office Visit) with Baldo DaubMunley, Brian J, MD.     Allergies:   Penicillins   Social History   Socioeconomic History  . Marital status: Married    Spouse name: Not on file  . Number of children: Not on file  . Years of education: Not on file  . Highest education level: Not on file  Occupational History  . Not on file  Social Needs  . Financial resource strain: Not on file  . Food insecurity:    Worry: Not on file    Inability: Not on file  . Transportation needs:    Medical: Not on file    Non-medical: Not on file  Tobacco Use  . Smoking status: Current Every Day Smoker    Packs/day: 0.50    Types: Cigarettes  . Smokeless tobacco: Never Used  Substance and Sexual Activity  . Alcohol use: No  . Drug use: No  . Sexual activity: Not on file  Lifestyle  . Physical activity:    Days per week: Not on file    Minutes per session: Not on file  . Stress: Not on file  Relationships  . Social connections:    Talks on phone: Not on file    Gets together: Not on file    Attends religious service: Not on file    Active member of club or organization: Not on file    Attends meetings of clubs or organizations: Not on file    Relationship status: Not on file  Other Topics Concern  . Not on file  Social History Narrative  . Not on file     Family History: The patient's family history includes Arthritis in his maternal grandfather, maternal grandmother, paternal grandfather, and paternal grandmother; Asthma in his sister; Cancer in his father; Diabetes in his sister;  Hypertension in his father; Hyperthyroidism in his father; Hypothyroidism in his mother; Stroke in his paternal grandmother.  ROS:   Review of Systems  Constitution: Positive for decreased appetite, malaise/fatigue and weight loss (40 lbs).  HENT: Negative.   Eyes: Negative.   Cardiovascular: Positive for chest pain.  Respiratory: Negative.   Endocrine: Negative.   Hematologic/Lymphatic: Negative.   Skin: Negative.   Musculoskeletal: Positive for back pain.  Gastrointestinal: Positive for abdominal pain.  Genitourinary: Negative.   Neurological: Negative.   Psychiatric/Behavioral: Negative.   Allergic/Immunologic: Negative.    Please see the history of present illness.     All  other systems reviewed and are negative.  EKGs/Labs/Other Studies Reviewed:    The following studies were reviewed today: [facility] the [facility] during the visit  EKG:  EKG is  ordered today.  The ekg ordered today demonstrates SRTH normal rSR' V1  Recent Labs: No results found for requested labs within last 8760 hours.  Recent Lipid Panel No results found for: CHOL, TRIG, HDL, CHOLHDL, VLDL, LDLCALC, LDLDIRECT  Physical Exam:    VS:  BP 108/68 (BP Location: Left Arm)   Pulse 68   Ht 5\' 10"  (1.778 m)   Wt 122 lb (55.3 kg)   SpO2 96%   BMI 17.51 kg/m     Wt Readings from Last 3 Encounters:  12/14/17 122 lb (55.3 kg)  05/23/16 118 lb (53.5 kg)  12/13/15 125 lb (56.7 kg)     GEN:  Well nourished, well developed in no acute distress HEENT: Normal NECK: No JVD; No carotid bruits LYMPHATICS: No lymphadenopathy CARDIAC: RRR, no murmurs, rubs, gallops RESPIRATORY:  Clear to auscultation without rales, wheezing or rhonchi  ABDOMEN: Soft, non-tender, non-distended MUSCULOSKELETAL:  No edema; No deformity  SKIN: Warm and dry NEUROLOGIC:  Alert and oriented x 3 PSYCHIATRIC:  Normal affect     Signed, Norman HerrlichBrian Munley, MD    12/14/2017 11:00 AM    [facility]

## 2017-12-14 ENCOUNTER — Encounter: Payer: Self-pay | Admitting: Cardiology

## 2017-12-14 ENCOUNTER — Encounter: Payer: Self-pay | Admitting: *Deleted

## 2017-12-14 ENCOUNTER — Ambulatory Visit (INDEPENDENT_AMBULATORY_CARE_PROVIDER_SITE_OTHER): Payer: BLUE CROSS/BLUE SHIELD | Admitting: Cardiology

## 2017-12-14 VITALS — BP 108/68 | HR 68 | Ht 70.0 in | Wt 122.0 lb

## 2017-12-14 DIAGNOSIS — M940 Chondrocostal junction syndrome [Tietze]: Secondary | ICD-10-CM

## 2017-12-14 DIAGNOSIS — R9431 Abnormal electrocardiogram [ECG] [EKG]: Secondary | ICD-10-CM

## 2017-12-14 DIAGNOSIS — R1013 Epigastric pain: Secondary | ICD-10-CM

## 2017-12-14 DIAGNOSIS — R079 Chest pain, unspecified: Secondary | ICD-10-CM

## 2017-12-14 NOTE — Patient Instructions (Addendum)
Medication Instructions:  Your physician recommends that you continue on your current medications as directed. Please refer to the Current Medication list given to you today.   Labwork: None  Testing/Procedures: You had an EKG today.   Your physician has requested that you have an echocardiogram. Echocardiography is a painless test that uses sound waves to create images of your heart. It provides your doctor with information about the size and shape of your heart and how well your heart's chambers and valves are working. This procedure takes approximately one hour. There are no restrictions for this procedure.  Follow-Up: Your physician recommends that you schedule a follow-up appointment in: 4 weeks.   If you need a refill on your cardiac medications before your next appointment, please call your pharmacy.   Thank you for choosing [facility]! Mady GemmaCatherine Lockhart, RN [phone]       Costochondritis Costochondritis is swelling and irritation (inflammation) of the tissue (cartilage) that connects your ribs to your breastbone (sternum). This causes pain in the front of your chest. Usually, the pain:  Starts gradually.  Is in more than one rib.  This condition usually goes away on its own over time. Follow these instructions at home:  Do not do anything that makes your pain worse.  If directed, put ice on the painful area: ? Put ice in a plastic bag. ? Place a towel between your skin and the bag. ? Leave the ice on for 20 minutes, 2-3 times a day.  If directed, put heat on the affected area as often as told by your doctor. Use the heat source that your doctor tells you to use, such as a moist heat pack or a heating pad. ? Place a towel between your skin and the heat source. ? Leave the heat on for 20-30 minutes. ? Take off the heat if your skin turns bright red. This is very important if you cannot feel pain, heat, or cold. You may have a greater risk of getting  burned.  Take over-the-counter and prescription medicines only as told by your doctor.  Return to your normal activities as told by your doctor. Ask your doctor what activities are safe for you.  Keep all follow-up visits as told by your doctor. This is important. Contact a doctor if:  You have chills or a fever.  Your pain does not go away or it gets worse.  You have a cough that does not go away. Get help right away if:  You are short of breath. This information is not intended to replace advice given to you by your health care provider. Make sure you discuss any questions you have with your health care provider. Document Released: 10/07/2007 Document Revised: 11/08/2015 Document Reviewed: 08/14/2015 Elsevier Interactive Patient Education  2018 [employer]    Echocardiogram An echocardiogram, or echocardiography, uses sound waves (ultrasound) to produce an image of your heart. The echocardiogram is simple, painless, obtained within a short period of time, and offers valuable information to your health care provider. The images from an echocardiogram can provide information such as:  Evidence of coronary artery disease (CAD).  Heart size.  Heart muscle function.  Heart valve function.  Aneurysm detection.  Evidence of a past heart attack.  Fluid buildup around the heart.  Heart muscle thickening.  Assess heart valve function.  Tell a health care provider about:  Any allergies you have.  All medicines you are taking, including vitamins, herbs, eye drops, creams, and over-the-counter medicines.  Any problems you or family members have had with anesthetic medicines.  Any blood disorders you have.  Any surgeries you have had.  Any medical conditions you have.  Whether you are pregnant or may be pregnant. What happens before the procedure? No special preparation is needed. Eat and drink normally. What happens during the procedure?  In order to produce an  image of your heart, gel will be applied to your chest and a wand-like tool (transducer) will be moved over your chest. The gel will help transmit the sound waves from the transducer. The sound waves will harmlessly bounce off your heart to allow the heart images to be captured in real-time motion. These images will then be recorded.  You may need an IV to receive a medicine that improves the quality of the pictures. What happens after the procedure? You may return to your normal schedule including diet, activities, and medicines, unless your health care provider tells you otherwise. This information is not intended to replace advice given to you by your health care provider. Make sure you discuss any questions you have with your health care provider. Document Released: 04/17/2000 Document Revised: 12/07/2015 Document Reviewed: 12/26/2012 Elsevier Interactive Patient Education  2017 [employer]

## 2017-12-15 ENCOUNTER — Ambulatory Visit (HOSPITAL_BASED_OUTPATIENT_CLINIC_OR_DEPARTMENT_OTHER)
Admission: RE | Admit: 2017-12-15 | Discharge: 2017-12-15 | Disposition: A | Discharge status: Home / Self Care | Payer: BLUE CROSS/BLUE SHIELD | Source: Ambulatory Visit | Attending: Cardiology | Admitting: Cardiology

## 2017-12-15 DIAGNOSIS — R079 Chest pain, unspecified: Secondary | ICD-10-CM | POA: Diagnosis not present

## 2017-12-15 DIAGNOSIS — R52 Pain, unspecified: Secondary | ICD-10-CM | POA: Diagnosis not present

## 2017-12-15 DIAGNOSIS — R1013 Epigastric pain: Secondary | ICD-10-CM | POA: Diagnosis not present

## 2017-12-15 DIAGNOSIS — R112 Nausea with vomiting, unspecified: Secondary | ICD-10-CM | POA: Diagnosis not present

## 2017-12-15 NOTE — Progress Notes (Signed)
  Echocardiogram 2D Echocardiogram has been performed.  Drew Floyd T Kyndall Chaplin 12/15/2017, 3:15 PM

## 2018-01-11 DIAGNOSIS — R634 Abnormal weight loss: Secondary | ICD-10-CM | POA: Diagnosis not present

## 2018-01-11 DIAGNOSIS — R1013 Epigastric pain: Secondary | ICD-10-CM | POA: Diagnosis not present

## 2018-01-11 DIAGNOSIS — F411 Generalized anxiety disorder: Secondary | ICD-10-CM | POA: Diagnosis not present

## 2018-01-11 DIAGNOSIS — G8929 Other chronic pain: Secondary | ICD-10-CM | POA: Diagnosis not present

## 2018-01-18 ENCOUNTER — Ambulatory Visit: Payer: BLUE CROSS/BLUE SHIELD | Admitting: Cardiology

## 2018-01-18 NOTE — Progress Notes (Deleted)
Cardiology Office Note:    Date:  01/18/2018   ID:  Drew Floyd, DOB 1980/08/31, MRN [id number]  PCP:  [facility]  Cardiologist:  Norman Herrlich, MD    Referring MD: [facility]    ASSESSMENT:    No diagnosis found. PLAN:    In order of problems listed above:  1. ***   Next appointment: ***   Medication Adjustments/Labs and Tests Ordered: Current medicines are reviewed at length with the patient today.  Concerns regarding medicines are outlined above.  No orders of the defined types were placed in this encounter.  No orders of the defined types were placed in this encounter.   No chief complaint on file.   History of Present Illness:    Drew Floyd is a 37 y.o. male with a hx of costochondral chest pain last seen 12/14/17. Echo 12/15/17 EF 60-65% normal study Compliance with diet, lifestyle and medications: *** Past Medical History:  Diagnosis Date  . Anxiety   . Chronic back pain   . Depression   . Diverticulitis   . Fibromyalgia   . Insomnia   . Kidney stone     Past Surgical History:  Procedure Laterality Date  . ABDOMINAL SURGERY    . APPENDECTOMY    . COLON RESECTION    . TONSILLECTOMY      Current Medications: No outpatient medications have been marked as taking for the 01/18/18 encounter (Appointment) with Baldo Daub, MD.     Allergies:   Penicillins   Social History   Socioeconomic History  . Marital status: Married    Spouse name: Not on file  . Number of children: Not on file  . Years of education: Not on file  . Highest education level: Not on file  Occupational History  . Not on file  Social Needs  . Financial resource strain: Not on file  . Food insecurity:    Worry: Not on file    Inability: Not on file  . Transportation needs:    Medical: Not on file    Non-medical: Not on file  Tobacco Use  . Smoking status: Current Every Day Smoker    Packs/day: 0.50    Types: Cigarettes  . Smokeless  tobacco: Never Used  Substance and Sexual Activity  . Alcohol use: No  . Drug use: No  . Sexual activity: Not on file  Lifestyle  . Physical activity:    Days per week: Not on file    Minutes per session: Not on file  . Stress: Not on file  Relationships  . Social connections:    Talks on phone: Not on file    Gets together: Not on file    Attends religious service: Not on file    Active member of club or organization: Not on file    Attends meetings of clubs or organizations: Not on file    Relationship status: Not on file  Other Topics Concern  . Not on file  Social History Narrative  . Not on file     Family History: The patient's ***family history includes Arthritis in his maternal grandfather, maternal grandmother, paternal grandfather, and paternal grandmother; Asthma in his sister; Cancer in his father; Diabetes in his sister; Hypertension in his father; Hyperthyroidism in his father; Hypothyroidism in his mother; Stroke in his paternal grandmother. ROS:   Please see the history of present illness.    All other systems reviewed and are negative.  EKGs/Labs/Other Studies Reviewed:  The following studies were reviewed today:  EKG:  EKG ordered today.  The ekg ordered today demonstrates ***  Recent Labs: No results found for requested labs within last 8760 hours.  Recent Lipid Panel No results found for: CHOL, TRIG, HDL, CHOLHDL, VLDL, LDLCALC, LDLDIRECT  Physical Exam:    VS:  There were no vitals taken for this visit.    Wt Readings from Last 3 Encounters:  12/14/17 122 lb (55.3 kg)  05/23/16 118 lb (53.5 kg)  12/13/15 125 lb (56.7 kg)     GEN: *** Well nourished, well developed in no acute distress HEENT: Normal NECK: No JVD; No carotid bruits LYMPHATICS: No lymphadenopathy CARDIAC: ***RRR, no murmurs, rubs, gallops RESPIRATORY:  Clear to auscultation without rales, wheezing or rhonchi  ABDOMEN: Soft, non-tender, non-distended MUSCULOSKELETAL:  No  edema; No deformity  SKIN: Warm and dry NEUROLOGIC:  Alert and oriented x 3 PSYCHIATRIC:  Normal affect    Signed, Norman HerrlichBrian Alexcia Schools, MD  01/18/2018 7:51 AM    [facility]

## 2018-03-17 DIAGNOSIS — M47815 Spondylosis without myelopathy or radiculopathy, thoracolumbar region: Secondary | ICD-10-CM | POA: Diagnosis not present

## 2018-03-17 DIAGNOSIS — F419 Anxiety disorder, unspecified: Secondary | ICD-10-CM | POA: Diagnosis not present

## 2018-03-17 DIAGNOSIS — R51 Headache: Secondary | ICD-10-CM | POA: Diagnosis not present

## 2018-03-19 DIAGNOSIS — R51 Headache: Secondary | ICD-10-CM | POA: Diagnosis not present

## 2018-03-24 DIAGNOSIS — G8929 Other chronic pain: Secondary | ICD-10-CM | POA: Diagnosis not present

## 2018-03-24 DIAGNOSIS — M5442 Lumbago with sciatica, left side: Secondary | ICD-10-CM | POA: Diagnosis not present

## 2018-03-24 DIAGNOSIS — M47816 Spondylosis without myelopathy or radiculopathy, lumbar region: Secondary | ICD-10-CM | POA: Diagnosis not present

## 2018-03-24 DIAGNOSIS — M5136 Other intervertebral disc degeneration, lumbar region: Secondary | ICD-10-CM | POA: Diagnosis not present

## 2018-03-24 DIAGNOSIS — G479 Sleep disorder, unspecified: Secondary | ICD-10-CM | POA: Diagnosis not present

## 2018-03-24 DIAGNOSIS — M5441 Lumbago with sciatica, right side: Secondary | ICD-10-CM | POA: Diagnosis not present

## 2018-06-02 DIAGNOSIS — J0100 Acute maxillary sinusitis, unspecified: Secondary | ICD-10-CM | POA: Diagnosis not present

## 2018-06-22 DIAGNOSIS — R1012 Left upper quadrant pain: Secondary | ICD-10-CM | POA: Diagnosis not present

## 2018-06-22 DIAGNOSIS — R110 Nausea: Secondary | ICD-10-CM | POA: Diagnosis not present

## 2019-01-26 DIAGNOSIS — M79622 Pain in left upper arm: Secondary | ICD-10-CM | POA: Diagnosis not present

## 2019-02-05 DIAGNOSIS — R072 Precordial pain: Secondary | ICD-10-CM | POA: Diagnosis not present

## 2019-02-05 DIAGNOSIS — S29012A Strain of muscle and tendon of back wall of thorax, initial encounter: Secondary | ICD-10-CM | POA: Diagnosis not present

## 2019-02-05 DIAGNOSIS — X500XXA Overexertion from strenuous movement or load, initial encounter: Secondary | ICD-10-CM | POA: Diagnosis not present

## 2019-02-05 DIAGNOSIS — Y999 Unspecified external cause status: Secondary | ICD-10-CM | POA: Diagnosis not present

## 2019-02-05 DIAGNOSIS — M549 Dorsalgia, unspecified: Secondary | ICD-10-CM | POA: Diagnosis not present

## 2019-02-06 DIAGNOSIS — R9431 Abnormal electrocardiogram [ECG] [EKG]: Secondary | ICD-10-CM | POA: Diagnosis not present
